# Patient Record
Sex: Male | Born: 1990 | Race: Black or African American | Hispanic: No | Marital: Single | Smoking: Current every day smoker
Health system: Southern US, Community
[De-identification: ages and names within clinical notes are randomized; demographics above are authoritative.]

## PROBLEM LIST (undated history)

## (undated) DIAGNOSIS — F23 Brief psychotic disorder: Secondary | ICD-10-CM

## (undated) DIAGNOSIS — F419 Anxiety disorder, unspecified: Secondary | ICD-10-CM

---

## 2018-03-01 ENCOUNTER — Emergency Department (HOSPITAL_COMMUNITY)
Admission: EM | Admit: 2018-03-01 | Discharge: 2018-03-01 | Disposition: A | Discharge status: Home / Self Care | Payer: Medicaid Other | Attending: Emergency Medicine | Admitting: Emergency Medicine

## 2018-03-01 ENCOUNTER — Other Ambulatory Visit: Payer: Self-pay

## 2018-03-01 ENCOUNTER — Encounter (HOSPITAL_COMMUNITY): Payer: Self-pay

## 2018-03-01 DIAGNOSIS — M25532 Pain in left wrist: Secondary | ICD-10-CM | POA: Diagnosis not present

## 2018-03-01 DIAGNOSIS — M79645 Pain in left finger(s): Secondary | ICD-10-CM | POA: Diagnosis present

## 2018-03-01 HISTORY — DX: Brief psychotic disorder: F23

## 2018-03-01 HISTORY — DX: Anxiety disorder, unspecified: F41.9

## 2018-03-01 MED ORDER — NAPROXEN 500 MG PO TABS: 500.0000 mg | ORAL_TABLET | Freq: Two times a day (BID) | ORAL | 0 refills | Status: AC

## 2018-03-01 NOTE — Discharge Instructions (Addendum)
Take naproxen 2 times a day with meals.  Do not take other anti-inflammatories at the same time open (Advil, Motrin, ibuprofen, Aleve). You may supplement with Tylenol if you need further pain control. Use the wrist splint as needed for comfort.  Wear it at night and during the day if able. Follow-up with [facility] for further evaluation of your wrist if pain is not improving in a week. Return to the emergency room if you develop inability to move your hand, persistent numbness of your hand, or any new or concerning symptoms.

## 2018-03-01 NOTE — ED Notes (Signed)
Pt states has left thumb pain. States it feels like it starts from under his part. Pt able to move left extremity well. Cap refill less than two seconds. States sleeps on left arm. Denies injury to left arm or thumb

## 2018-03-01 NOTE — ED Provider Notes (Signed)
[facility] EMERGENCY DEPARTMENT Provider Note   CSN: [id number] Arrival date & time: 03/01/18  1817     History   Chief Complaint Chief Complaint  Patient presents with  . thumb/forearm pain    HPI Brian Fernandez is a 27 y.o. male presenting for evaluation of left-sided thumb and forearm pain.  Patient states he has intermittent pain beginning in his thumb and radiating up his forearm.  This is been going on for the past 3 days.  He reports he has been playing more basketball recently, no other change in activity.  He is repetitive motion.  He denies fall, trauma, or injury.  This began when he pushed up from his chair with his hand.  He states he intermittently sleeps on his left hand.  Denies numbness or tingling.  Denies redness, warmth, or swelling.  No pain elsewhere.  He has not taken anything for his symptoms including Tylenol or ibuprofen.  HPI  Past Medical History:  Diagnosis Date  . Anxiety   . Schizophrenia, acute (HCC)     There are no active problems to display for this patient.   History reviewed. No pertinent surgical history.      Home Medications    Prior to Admission medications   Medication Sig Start Date End Date Taking? Authorizing Provider  naproxen (NAPROSYN) 500 MG tablet Take 1 tablet (500 mg total) by mouth 2 (two) times daily with a meal. 03/01/18   Kenyanna Grzesiak, PA-C    Family History No family history on file.  Social History Social History   Tobacco Use  . Smoking status: Not on file  Substance Use Topics  . Alcohol use: Not on file  . Drug use: Not on file     Allergies   Patient has no known allergies.   Review of Systems Review of Systems  Musculoskeletal: Positive for arthralgias and myalgias.  Neurological: Negative for numbness.     Physical Exam Updated Vital Signs BP 126/76 (BP Location: Right Arm)   Pulse 61   Temp 98.3 F (36.8 C) (Oral)   Resp 19   Ht 5\' 11"  (1.803 m)   Wt  106.1 kg (234 lb)   SpO2 98%   BMI 32.64 kg/m   Physical Exam  Constitutional: He is oriented to person, place, and time. He appears well-developed and well-nourished. No distress.  HENT:  Head: Normocephalic and atraumatic.  Eyes: EOM are normal.  Neck: Normal range of motion.  Pulmonary/Chest: Effort normal.  Abdominal: He exhibits no distension.  Musculoskeletal: Normal range of motion.  Full active range of motion of the wrist and thumb without pain.  No obvious swelling, warmth, erythema.  No deformity.  Strength of all fingers against resistance intact.  Radial pulses intact bilaterally.  Sensation intact bilaterally.  No pain with Phalen's or Tinel's.  Mild discomfort with Finkelstein's.  Good cap refill.  Neurological: He is alert and oriented to person, place, and time. No sensory deficit.  Skin: Skin is warm. Capillary refill takes less than 2 seconds. No rash noted.  Psychiatric: He has a normal mood and affect.  Nursing note and vitals reviewed.    ED Treatments / Results  Labs (all labs ordered are listed, but only abnormal results are displayed) Labs Reviewed - No data to display  EKG None  Radiology No results found.  Procedures Procedures (including critical care time)  Medications Ordered in ED Medications - No data to display   Initial Impression / Assessment  and Plan / ED Course  I have reviewed the triage vital signs and the nursing notes.  Pertinent labs & imaging results that were available during my care of the patient were reviewed by me and considered in my medical decision making (see chart for details).     Patient presenting for evaluation of intermittent left thumb and forearm pain.  Physical exam reassuring, he is neurovascularly intact.  Doubt septic joint.  Likely muscular, due to patient's intermittent behavior and beginning with pushing up off the chair.  Doubt bony injury, I do not believe x-rays would be beneficial at this time.   Discussed findings with patient.  Discussed treatment with NSAIDs and wrist splint.  Follow-up with primary care as needed.  At this time, patient appears safe for discharge.  Return precautions given.  Patient states he understands and agrees to plan.   Final Clinical Impressions(s) / ED Diagnoses   Final diagnoses:  Left wrist pain    ED Discharge Orders        Ordered    naproxen (NAPROSYN) 500 MG tablet  2 times daily with meals     03/01/18 2022       Alveria ApleyCaccavale, Keagon Glascoe, PA-C 03/01/18 2245    Nira Connardama, Pedro Eduardo, MD 03/02/18 1309

## 2018-03-01 NOTE — ED Triage Notes (Signed)
Patient reports intermittent left hand pain running up forearm, describes as sharp/tingling sensation along radial nerve, denies trauma. Good equal grips, no trauma

## 2018-03-01 NOTE — Progress Notes (Signed)
Orthopedic Tech Progress Note Patient Details:  Brian SalinaDeshawn Tinkey 1991/09/21 [id number]  Ortho Devices Type of Ortho Device: Velcro wrist splint Ortho Device/Splint Interventions: Application   Post Interventions Patient Tolerated: Well Instructions Provided: Care of device   Saul FordyceJennifer C Carlus Stay 03/01/2018, 8:30 PM

## 2018-07-17 ENCOUNTER — Encounter (HOSPITAL_COMMUNITY): Payer: Self-pay

## 2018-07-17 ENCOUNTER — Emergency Department (HOSPITAL_COMMUNITY)
Admission: EM | Admit: 2018-07-17 | Discharge: 2018-07-17 | Disposition: A | Discharge status: Home / Self Care | Payer: Medicaid Other | Attending: Emergency Medicine | Admitting: Emergency Medicine

## 2018-07-17 ENCOUNTER — Other Ambulatory Visit: Payer: Self-pay

## 2018-07-17 DIAGNOSIS — Z79899 Other long term (current) drug therapy: Secondary | ICD-10-CM | POA: Diagnosis not present

## 2018-07-17 DIAGNOSIS — F17210 Nicotine dependence, cigarettes, uncomplicated: Secondary | ICD-10-CM | POA: Diagnosis not present

## 2018-07-17 DIAGNOSIS — J020 Streptococcal pharyngitis: Secondary | ICD-10-CM | POA: Diagnosis not present

## 2018-07-17 DIAGNOSIS — J029 Acute pharyngitis, unspecified: Secondary | ICD-10-CM | POA: Diagnosis present

## 2018-07-17 LAB — GROUP A STREP BY PCR: Group A Strep by PCR: DETECTED — AB

## 2018-07-17 MED ORDER — CLINDAMYCIN HCL 300 MG PO CAPS: 300.0000 mg | ORAL_CAPSULE | Freq: Three times a day (TID) | ORAL | 0 refills | Status: AC

## 2018-07-17 NOTE — ED Provider Notes (Signed)
[facility] EMERGENCY DEPARTMENT Provider Note   CSN: [id number] Arrival date & time: 07/17/18  1021     History   Chief Complaint Chief Complaint  Patient presents with  . Sore Throat    HPI Brian Fernandez is a 27 y.o. male presenting for 5 days of sore throat.  Patient states that pain developed gradually over the last 5 days and has worsened, now describes his pain as a sharp 8/10 in severity that is worse with swallowing.  Patient states that he has not taken any medication for his pain.  Patient states that he has felt warm but has not measured a fever.  Patient denies change in appetite, drooling, voice change, inability to swallow solids or liquids, facial swelling, shortness of breath.  HPI  Past Medical History:  Diagnosis Date  . Anxiety   . Schizophrenia, acute (HCC)     There are no active problems to display for this patient.   History reviewed. No pertinent surgical history.      Home Medications    Prior to Admission medications   Medication Sig Start Date End Date Taking? Authorizing Provider  LATUDA 80 MG TABS tablet Take 80 mg by mouth daily. 06/13/18  Yes [provider]  traZODone (DESYREL) 100 MG tablet Take 100 mg by mouth at bedtime. 06/13/18  Yes [provider]  clindamycin (CLEOCIN) 300 MG capsule Take 1 capsule (300 mg total) by mouth 3 (three) times daily for 10 days. 07/17/18 07/27/18  Harlene Salts A, PA-C  naproxen (NAPROSYN) 500 MG tablet Take 1 tablet (500 mg total) by mouth 2 (two) times daily with a meal. Patient not taking: Reported on 07/17/2018 03/01/18   Alveria Apley, PA-C    Family History History reviewed. No pertinent family history.  Social History Social History   Tobacco Use  . Smoking status: Current Every Day Smoker    Packs/day: 0.50    Types: Cigarettes  Substance Use Topics  . Alcohol use: Yes    Comment: occ  . Drug use: Never     Allergies    Penicillins   Review of Systems Review of Systems  Constitutional: Positive for fever. Negative for chills and fatigue.  HENT: Positive for sore throat. Negative for congestion, rhinorrhea, trouble swallowing and voice change.   Respiratory: Negative.  Negative for cough, shortness of breath and stridor.   Gastrointestinal: Negative.  Negative for abdominal pain, diarrhea, nausea and vomiting.  Musculoskeletal: Negative.  Negative for arthralgias and myalgias.  Skin: Negative.  Negative for rash.     Physical Exam Updated Vital Signs BP 129/68 (BP Location: Left Arm)   Pulse 60   Temp 98.4 F (36.9 C) (Oral)   Resp 16   Ht 5\' 10"  (1.778 m)   Wt 106.1 kg   SpO2 100%   BMI 33.58 kg/m   Physical Exam  Constitutional: He is oriented to person, place, and time. He appears well-developed and well-nourished. He does not appear ill. No distress.  HENT:  Head: Normocephalic and atraumatic.  Right Ear: Hearing and external ear normal.  Left Ear: Hearing and external ear normal.  Nose: Nose normal.  Mouth/Throat: Uvula is midline. No trismus in the jaw. No uvula swelling. No posterior oropharyngeal edema, posterior oropharyngeal erythema or tonsillar abscesses. Tonsils are 2+ on the right. Tonsils are 2+ on the left. Tonsillar exudate.  Moderate bilateral tonsillar swelling with erythema scant exudate present. Airway intact, tonsils are not touching midline, there is no uvula  deviation, patient can swallow without difficulty.  No signs suggestive of peritonsillar abscess. No signs suggestive of Ludwig's angina. No signs suggestive of retropharyngeal abscess.  Eyes: Pupils are equal, round, and reactive to light. EOM are normal.  Neck: Trachea normal, normal range of motion, full passive range of motion without pain and phonation normal. Neck supple. No spinous process tenderness and no muscular tenderness present. No neck rigidity. No tracheal deviation and normal range of motion  present.    Right sided cervical anterior lymphadenopathy.  Pulmonary/Chest: Effort normal. No stridor. No respiratory distress.  Abdominal: Soft. There is no tenderness. There is no rebound and no guarding.  Musculoskeletal: Normal range of motion.  Lymphadenopathy:    He has cervical adenopathy.  Neurological: He is alert and oriented to person, place, and time.  Skin: Skin is warm and dry.  Psychiatric: He has a normal mood and affect. His behavior is normal.     ED Treatments / Results  Labs (all labs ordered are listed, but only abnormal results are displayed) Labs Reviewed  GROUP A STREP BY PCR - Abnormal; Notable for the following components:      Result Value   Group A Strep by PCR DETECTED (*)    All other components within normal limits    EKG None  Radiology No results found.  Procedures Procedures (including critical care time)  Medications Ordered in ED Medications - No data to display   Initial Impression / Assessment and Plan / ED Course  I have reviewed the triage vital signs and the nursing notes.  Pertinent labs & imaging results that were available during my care of the patient were reviewed by me and considered in my medical decision making (see chart for details).    Patient afebrile with tonsillar exudate, right cervical lymphadenopathy, & dysphagia; diagnosis of strep.  Presentation non concerning for PTA or infxn spread to soft tissue.  No sign of retropharyngeal abscess or Ludwig's angina.  No trismus or uvula deviation. Specific return precautions discussed. Pt able to drink water in ED without difficulty with intact air way.  Patient with penicillin allergy.  This was discussed with patient, clindamycin 300 p.o. 3 times daily prescribed for the next 10 days.  At this time there does not appear to be any evidence of an acute emergency medical condition and the patient appears stable for discharge with appropriate outpatient follow up. Diagnosis  was discussed with patient who verbalizes understanding of care plan and is agreeable to discharge. I have discussed return precautions with patient who verbalizes understanding of return precautions. Patient strongly encouraged to follow-up with their PCP. All questions answered.   Note: Portions of this report may have been transcribed using voice recognition software. Every effort was made to ensure accuracy; however, inadvertent computerized transcription errors may still be present.    Final Clinical Impressions(s) / ED Diagnoses   Final diagnoses:  Strep pharyngitis    ED Discharge Orders         Ordered    clindamycin (CLEOCIN) 300 MG capsule  3 times daily     07/17/18 1227           Elizabeth PalauMorelli, Brandon A, PA-C 07/17/18 1436    Tegeler, Canary Brimhristopher J, MD 07/17/18 1525

## 2018-07-17 NOTE — ED Triage Notes (Signed)
Pt endorses sore throat x 1 week. Redness and mild swelling noted to tonsils. VSS, Afebrile.

## 2018-07-17 NOTE — Discharge Instructions (Addendum)
Please return to the Emergency Department for any new or worsening symptoms or if your symptoms do not improve. Please be sure to follow up with your Primary Care Physician as soon as possible regarding your visit today. If you do not have a Primary Doctor please use the resources below to establish one. Please take all of your antibiotic medication, clindamycin as prescribed.  Please take an over-the-counter probiotic medication to try and avoid diarrhea while using this medication. You may also use over-the-counter anti-inflammatory medications such as Tylenol as directed on the package for pain. Please be sure to drink plenty of fluids to avoid dehydration.  Contact a health care provider if: The glands in your neck continue to get bigger. You develop a rash, cough, or earache. You cough up a thick liquid that is green, yellow-brown, or bloody. You have pain or discomfort that does not get better with medicine. Your problems seem to be getting worse rather than better. You have a fever. Get help right away if: You have new symptoms, such as vomiting, severe headache, stiff or painful neck, chest pain, or shortness of breath. You have severe throat pain, drooling, or changes in your voice. You have swelling of the neck, or the skin on the neck becomes red and tender. You have signs of dehydration, such as fatigue, dry mouth, and decreased urination. You become increasingly sleepy, or you cannot wake up completely. Your joints become red or painful. You have trouble swallowing.  RESOURCE GUIDE  Chronic Pain Problems: Contact [facility]  [phone] Patients need to be referred by their primary care doctor.  Insufficient Money for Medicine: Contact United Way:  call "211" or [facility] [phone].  No Primary Care Doctor: Call [facility]  [phone] - can help you locate a primary care doctor that  accepts your insurance, provides certain services,  etc. Physician Referral Service- [phone]  Agencies that provide inexpensive medical care: [facility]  [phone] [facility]  [phone] [facility]  [phone] [facility]  [phone] Planned Parenthood  [phone] [facility]  [phone]  Medicaid-accepting [facility] Providers: [facility]- [address], [address]  [phone], Mon-Fri 9am-7pm, Sat 9am-1pm [facility]- [address], [address] [place]  [phone] [facility]- [address], [address] [place]  [phone] [facility]- [address]  [phone] Renaye RakersVeita Bland- [address], [address], [phone]  Only accepts [place] patients after they have their name  applied to their card  Self Pay (no insurance) in [place] [facility]: Sickle Cell Patients: Dr Willey BladeEric Dean, [facility]  [address], [phone] [facility]- [address]  [phone]       Redge Gainer-     [facility]- [address], [address]       -     [facility]- see information above (Speak to CitigroupPam H if you do not have insurance)       -  [facility]- [address], [phone]       -  [facility]- [address],  [phone]       -  [facility]- [address] [place] [address], [phone]       -  Dr Julio Sickssei-Bonsu-  [address], [address], [place], [phone]       -  [facility]- [address] [phone]       -  [facility]- [address] [phone], also [address] [phone]       -    [facility]- [address] [place], [phone], 1st & 3rd Saturday   every month, 10am-1pm  1) Find a Doctor and Pay Out of Pocket Although you won't have to find out who is covered by your insurance plan, it is a good idea to ask around and get recommendations. You will then need to call the  office and see if the doctor you have chosen will accept you as a new patient and what types of options they offer for patients who are self-pay. Some doctors offer discounts or will set up payment plans for their patients who do not have insurance, but you will need to ask so you aren't surprised when you get to your appointment.  2) Contact Your Local Health Department Not all health departments have doctors that can see patients for sick visits, but many do, so it is worth a call to see if yours does. If you don't know where your local health department is, you can check in your phone book. The CDC also has a tool to help you locate your state's health department, and many state websites also have listings of all of their local health departments.  3) Find a Walk-in Clinic If your illness is not likely to be very severe or complicated, you may want to try a walk in clinic. These are popping up all over the country in pharmacies, drugstores, and shopping centers. They're usually staffed by nurse practitioners or physician assistants that have been trained to treat common illnesses and complaints. They're usually fairly quick and inexpensive. However, if you have serious medical issues or chronic medical problems, these are probably not your best option  STD Testing [facility] of [facility] [place], STD Clinic, [address] [place], phone [phone] or [phone].  Monday - Friday, call for an appointment. [facility] of [employer], STD Clinic, [address], [place], phone [phone] or [phone].  Monday - Friday, call for an appointment.  Abuse/Neglect: [facility] Child Abuse Hotline 437 608 5087 [facility] Child Abuse Hotline 858-155-8576 (After Hours)  Emergency Shelter:  [facility] 231-424-8366  Maternity Homes: [facility] 517-834-8293 [facility] [phone]  MRSA Hotline #:   (775) 870-5311  [employer] Resources  [facility] [place]  United Way [facility] Dept. [address]                 [address]         [address]  [place]                                               Cristobal Goldmann Phone:  [phone]  Phone:  [phone]                   Phone:  [place], [place]- [phone]       -     [facility] in [place], [address],                                  [place] [phone] or [phone] (After Hours)   [place]  Substance Abuse Resources: Alcohol and Drug Services  430-781-4845 [place] 979-062-5835 [facility] Chinita Pester 7184048608 Residential & Outpatient Substance Abuse Program  4634431883  Psychological Services: [place]  [phone] [place], [place]. [address] [place], [place]: [phone] or [phone], PicCapture.uy  Dental Assistance  If unable to pay or uninsured, contact:  [facility] or [facility]. to become qualified for the adult dental clinic.  Patients with Medicaid: [facility] [phone], [place] [address], [phone]  If unable to pay, or uninsured, contact [facility] [phone]) or [place] [phone] in [place], [place] in [facility]) to become qualified for the adult dental clinic   Other [place], [place], [place], [address], [place], [place], 2nd and 4th Thursday of the month  at 6:30am.  10 clients each day by appointment, can sometimes see walk-in patients if someone does not show for an appointment. [facility]- [address] Hillard Danker [place], [place], [address], [place], [place], [place], [address], [place] Department- [phone] [place] [facility] [phone]

## 2019-09-01 ENCOUNTER — Emergency Department (HOSPITAL_COMMUNITY): Payer: Medicare Other

## 2019-09-01 ENCOUNTER — Other Ambulatory Visit: Payer: Self-pay

## 2019-09-01 ENCOUNTER — Emergency Department (HOSPITAL_COMMUNITY)
Admission: EM | Admit: 2019-09-01 | Discharge: 2019-09-01 | Disposition: A | Discharge status: Home / Self Care | Payer: Medicare Other | Attending: Emergency Medicine | Admitting: Emergency Medicine

## 2019-09-01 ENCOUNTER — Encounter (HOSPITAL_COMMUNITY): Payer: Self-pay | Admitting: Emergency Medicine

## 2019-09-01 DIAGNOSIS — Z79899 Other long term (current) drug therapy: Secondary | ICD-10-CM | POA: Diagnosis not present

## 2019-09-01 DIAGNOSIS — R079 Chest pain, unspecified: Secondary | ICD-10-CM | POA: Diagnosis not present

## 2019-09-01 DIAGNOSIS — F17210 Nicotine dependence, cigarettes, uncomplicated: Secondary | ICD-10-CM | POA: Diagnosis not present

## 2019-09-01 MED ORDER — FAMOTIDINE 20 MG PO TABS: 20.0000 mg | ORAL_TABLET | Freq: Two times a day (BID) | ORAL | 0 refills | Status: AC

## 2019-09-01 NOTE — Discharge Instructions (Signed)
Please read attached information. If you experience any new or worsening signs or symptoms please return to the emergency room for evaluation. Please follow-up with your primary care provider or specialist as discussed. Please use medication prescribed only as directed and discontinue taking if you have any concerning signs or symptoms.   °

## 2019-09-01 NOTE — ED Provider Notes (Signed)
[facility] EMERGENCY DEPARTMENT Provider Note   CSN: [id number] Arrival date & time: 09/01/19  1549     History   Chief Complaint Chief Complaint  Patient presents with  . Chest Pain    HPI Brian Fernandez is a 28 y.o. male.     HPI   28 year old male presents today with complaints of chest pain.  Patient notes over the last month he has had intermittent sharp chest pain.  He notes this is completely random other than when he is getting ready to fall asleep.  He notes that sharp in nature coming and going.  He notes every day he has the symptoms they have been more frequent recently.  He denies any radiation of symptoms.  Denies any indigestion or burning up into his throat.  Denies any associated shortness of breath cough, or fever.  He denies any history DVT or PE or any significant cardiac history.  He notes he does smoke.  Denies any other chronic health conditions.  No medications prior to arrival.  Patient is not having symptoms at the time of my evaluation but notes he had a 1 episode prior to my arrival.   Past Medical History:  Diagnosis Date  . Anxiety   . Schizophrenia, acute ([place])     There are no active problems to display for this patient.   History reviewed. No pertinent surgical history.     Home Medications    Prior to Admission medications   Medication Sig Start Date End Date Taking? Authorizing Provider  famotidine (PEPCID) 20 MG tablet Take 1 tablet (20 mg total) by mouth 2 (two) times daily. 09/01/19   Eural Holzschuh, Dellis Filbert, PA-C  LATUDA 80 MG TABS tablet Take 80 mg by mouth daily. 06/13/18   [provider]  naproxen (NAPROSYN) 500 MG tablet Take 1 tablet (500 mg total) by mouth 2 (two) times daily with a meal. Patient not taking: Reported on 07/17/2018 03/01/18   Caccavale, Sophia, PA-C  traZODone (DESYREL) 100 MG tablet Take 100 mg by mouth at bedtime. 06/13/18   [provider]    Family History No family history on  file.  Social History Social History   Tobacco Use  . Smoking status: Current Every Day Smoker    Packs/day: 0.50    Types: Cigarettes  Substance Use Topics  . Alcohol use: Yes    Comment: occ  . Drug use: Never     Allergies   Penicillins   Review of Systems Review of Systems  All other systems reviewed and are negative.  Physical Exam Updated Vital Signs BP 127/79 (BP Location: Left Arm)   Pulse 65   Temp 98.1 F (36.7 C) (Oral)   Resp 18   SpO2 100%   Physical Exam Vitals signs and nursing note reviewed.  Constitutional:      Appearance: He is well-developed.  HENT:     Head: Normocephalic and atraumatic.  Eyes:     General: No scleral icterus.       Right eye: No discharge.        Left eye: No discharge.     Conjunctiva/sclera: Conjunctivae normal.     Pupils: Pupils are equal, round, and reactive to light.  Neck:     Musculoskeletal: Normal range of motion.     Vascular: No JVD.     Trachea: No tracheal deviation.  Cardiovascular:     Rate and Rhythm: Normal rate and regular rhythm.  Pulmonary:  Effort: Pulmonary effort is normal. No respiratory distress.     Breath sounds: Normal breath sounds. No stridor. No wheezing, rhonchi or rales.  Chest:     Chest wall: No tenderness.  Musculoskeletal:        General: No swelling.     Comments: No LEE  Neurological:     Mental Status: He is alert and oriented to person, place, and time.     Coordination: Coordination normal.  Psychiatric:        Behavior: Behavior normal.        Thought Content: Thought content normal.        Judgment: Judgment normal.      ED Treatments / Results  Labs (all labs ordered are listed, but only abnormal results are displayed) Labs Reviewed - No data to display  EKG EKG Interpretation  Date/Time:  Tuesday September 01 2019 16:19:49 EDT Ventricular Rate:  68 PR Interval:  156 QRS Duration: 82 QT Interval:  360 QTC Calculation: 382 R Axis:   93 Text  Interpretation:  Normal sinus rhythm Rightward axis Cannot rule out Anterior infarct , age undetermined Abnormal ECG Confirmed by Zadie Rhine ([id number]) on 09/02/2019 9:32:08 AM   Radiology Dg Chest 2 View  Result Date: 09/01/2019 CLINICAL DATA:  Chest pain EXAM: CHEST - 2 VIEW COMPARISON:  None. FINDINGS: The heart size and mediastinal contours are within normal limits. Both lungs are clear. The visualized skeletal structures are unremarkable. IMPRESSION: No active cardiopulmonary disease. Electronically Signed   By: Jasmine Pang M.D.   On: 09/01/2019 19:05    Procedures Procedures (including critical care time)  Medications Ordered in ED Medications - No data to display   Initial Impression / Assessment and Plan / ED Course  I have reviewed the triage vital signs and the nursing notes.  Pertinent labs & imaging results that were available during my care of the patient were reviewed by me and considered in my medical decision making (see chart for details).         Assessment/Plan: 28 year old male presents today with chest discomfort.  This is sharp in nature coming and going nonsevere.  He has no signs or symptoms presently.  EKG reassuring very low suspicion for any ACS dissection or pulmonary embolism.  He has no signs of infectious etiology.  Will obtain chest x-ray.  Anticipate discharge with outpatient primary care follow-up.  Lower suspicion for GI source but will attempt Pepcid therapy.    Final Clinical Impressions(s) / ED Diagnoses   Final diagnoses:  Chest pain, unspecified type    ED Discharge Orders         Ordered    famotidine (PEPCID) 20 MG tablet  2 times daily     09/01/19 1815           Eyvonne Mechanic, PA-C 09/02/19 1439    Lorre Nick, MD 09/02/19 1651

## 2019-09-01 NOTE — ED Triage Notes (Signed)
Pt endorses CP last month under left breast when sneezing. Endorses back pain

## 2019-09-03 ENCOUNTER — Encounter (HOSPITAL_COMMUNITY): Payer: Self-pay | Admitting: Emergency Medicine

## 2019-09-03 ENCOUNTER — Other Ambulatory Visit: Payer: Self-pay

## 2019-09-03 ENCOUNTER — Emergency Department (HOSPITAL_COMMUNITY)
Admission: EM | Admit: 2019-09-03 | Discharge: 2019-09-03 | Disposition: A | Discharge status: Home / Self Care | Payer: Medicare Other | Attending: Emergency Medicine | Admitting: Emergency Medicine

## 2019-09-03 DIAGNOSIS — F419 Anxiety disorder, unspecified: Secondary | ICD-10-CM | POA: Insufficient documentation

## 2019-09-03 DIAGNOSIS — Z79899 Other long term (current) drug therapy: Secondary | ICD-10-CM | POA: Insufficient documentation

## 2019-09-03 DIAGNOSIS — F17210 Nicotine dependence, cigarettes, uncomplicated: Secondary | ICD-10-CM | POA: Insufficient documentation

## 2019-09-03 DIAGNOSIS — F209 Schizophrenia, unspecified: Secondary | ICD-10-CM | POA: Insufficient documentation

## 2019-09-03 MED ORDER — LURASIDONE HCL 40 MG PO TABS
80.0000 mg | ORAL_TABLET | Freq: Once | ORAL | Status: AC
  Administered 2019-09-03: 80 mg via ORAL
  Filled 2019-09-03: qty 2

## 2019-09-03 NOTE — Discharge Instructions (Signed)
Please follow up with your doctor tomorrow as previously scheduled to have your medication refill and to discuss better management of your anxiety.

## 2019-09-03 NOTE — ED Triage Notes (Signed)
Pt reports an increase in his anxiety. Pt reports he is supposed to be on Latuda but has not had it in a month. Pt reports he needs to get his prescription refilled and has a doctor's appt on 10/9.

## 2019-09-03 NOTE — ED Provider Notes (Signed)
[facility] EMERGENCY DEPARTMENT Provider Note   CSN: [id number] Arrival date & time: 09/03/19  1142     History   Chief Complaint Chief Complaint  Patient presents with  . Anxiety    HPI Brian Fernandez is a 28 y.o. male.     The history is provided by the patient and medical records. No language interpreter was used.  Anxiety     28 year old male with known history of schizophrenia, anxiety, presenting with complaints of feeling anxious.  Patient report he ran out of his Latuda medication for approximately a week.  He believes his doctor did not prescribe enough.  He does have an appointment with his doctor tomorrow for medication refill.  He feeling increasingly anxious and felt that is related to not being on his medication.  He does not complain of any homicidal or suicidal ideation.  He does not have any other specific complaint.  Patient is here requesting for medication refilled.  Denies any increase in stress.  Past Medical History:  Diagnosis Date  . Anxiety   . Schizophrenia, acute ([place])     There are no active problems to display for this patient.   History reviewed. No pertinent surgical history.      Home Medications    Prior to Admission medications   Medication Sig Start Date End Date Taking? Authorizing Provider  famotidine (PEPCID) 20 MG tablet Take 1 tablet (20 mg total) by mouth 2 (two) times daily. 09/01/19   Hedges, Dellis Filbert, PA-C  LATUDA 80 MG TABS tablet Take 80 mg by mouth daily. 06/13/18   [provider]  naproxen (NAPROSYN) 500 MG tablet Take 1 tablet (500 mg total) by mouth 2 (two) times daily with a meal. Patient not taking: Reported on 07/17/2018 03/01/18   Caccavale, Sophia, PA-C  traZODone (DESYREL) 100 MG tablet Take 100 mg by mouth at bedtime. 06/13/18   [provider]    Family History No family history on file.  Social History Social History   Tobacco Use  . Smoking status: Current Every Day  Smoker    Packs/day: 0.50    Types: Cigarettes  . Smokeless tobacco: Never Used  Substance Use Topics  . Alcohol use: Yes    Comment: occ  . Drug use: Never     Allergies   Penicillins   Review of Systems Review of Systems  All other systems reviewed and are negative.    Physical Exam Updated Vital Signs BP 114/72 (BP Location: Left Arm)   Pulse 74   Temp 97.9 F (36.6 C) (Oral)   Resp 20   Ht 5\' 11"  (1.803 m)   Wt 122.5 kg   SpO2 96%   BMI 37.66 kg/m   Physical Exam Vitals signs and nursing note reviewed.  Constitutional:      General: He is not in acute distress.    Appearance: He is well-developed.  HENT:     Head: Atraumatic.  Eyes:     Conjunctiva/sclera: Conjunctivae normal.  Neck:     Musculoskeletal: Neck supple.  Cardiovascular:     Rate and Rhythm: Normal rate and regular rhythm.     Pulses: Normal pulses.     Heart sounds: Normal heart sounds.  Pulmonary:     Breath sounds: Normal breath sounds.  Abdominal:     Palpations: Abdomen is soft.  Skin:    Findings: No rash.  Neurological:     Mental Status: He is alert and oriented to person, place,  and time.  Psychiatric:        Mood and Affect: Mood normal.        Behavior: Behavior is cooperative.        Thought Content: Thought content does not include homicidal or suicidal ideation.      ED Treatments / Results  Labs (all labs ordered are listed, but only abnormal results are displayed) Labs Reviewed - No data to display  EKG None  Radiology Dg Chest 2 View  Result Date: 09/01/2019 CLINICAL DATA:  Chest pain EXAM: CHEST - 2 VIEW COMPARISON:  None. FINDINGS: The heart size and mediastinal contours are within normal limits. Both lungs are clear. The visualized skeletal structures are unremarkable. IMPRESSION: No active cardiopulmonary disease. Electronically Signed   By: Jasmine Pang M.D.   On: 09/01/2019 19:05    Procedures Procedures (including critical care time)   Medications Ordered in ED Medications - No data to display   Initial Impression / Assessment and Plan / ED Course  I have reviewed the triage vital signs and the nursing notes.  Pertinent labs & imaging results that were available during my care of the patient were reviewed by me and considered in my medical decision making (see chart for details).        BP 114/72 (BP Location: Left Arm)   Pulse 74   Temp 97.9 F (36.6 C) (Oral)   Resp 20   Ht 5\' 11"  (1.803 m)   Wt 122.5 kg   SpO2 96%   BMI 37.66 kg/m    Final Clinical Impressions(s) / ED Diagnoses   Final diagnoses:  Anxiety    ED Discharge Orders    None     1:50 PM Patient with history of schizophrenia currently on Latuda however he ran out of medication for over a week.  He is here with increased anxiety and request for a refill of his Latuda.  Fortunately he will be able to follow-up with his PCP tomorrow for medication refill.  He is denying SI or HI.  He is well-appearing, calm and cooperative, and in no acute discomfort.  We will give his home dose of Latuda here and encourage patient to follow-up with PCP tomorrow as previously scheduled.  Return precaution discussed.   , PA-C 09/03/19 1541    11/03/19, MD 09/04/19 [phone]

## 2019-09-03 NOTE — ED Notes (Signed)
Patient verbalizes understanding of discharge instructions. Opportunity for questioning and answers were provided. Armband removed by staff, pt discharged from ED ambulatory to home.  

## 2020-09-14 IMAGING — DX DG CHEST 2V
2 series · 2 of 2 positions shown · non-contrast
Comparison: None.

CLINICAL DATA: Chest pain

EXAM:
CHEST - 2 VIEW

[chest pa]
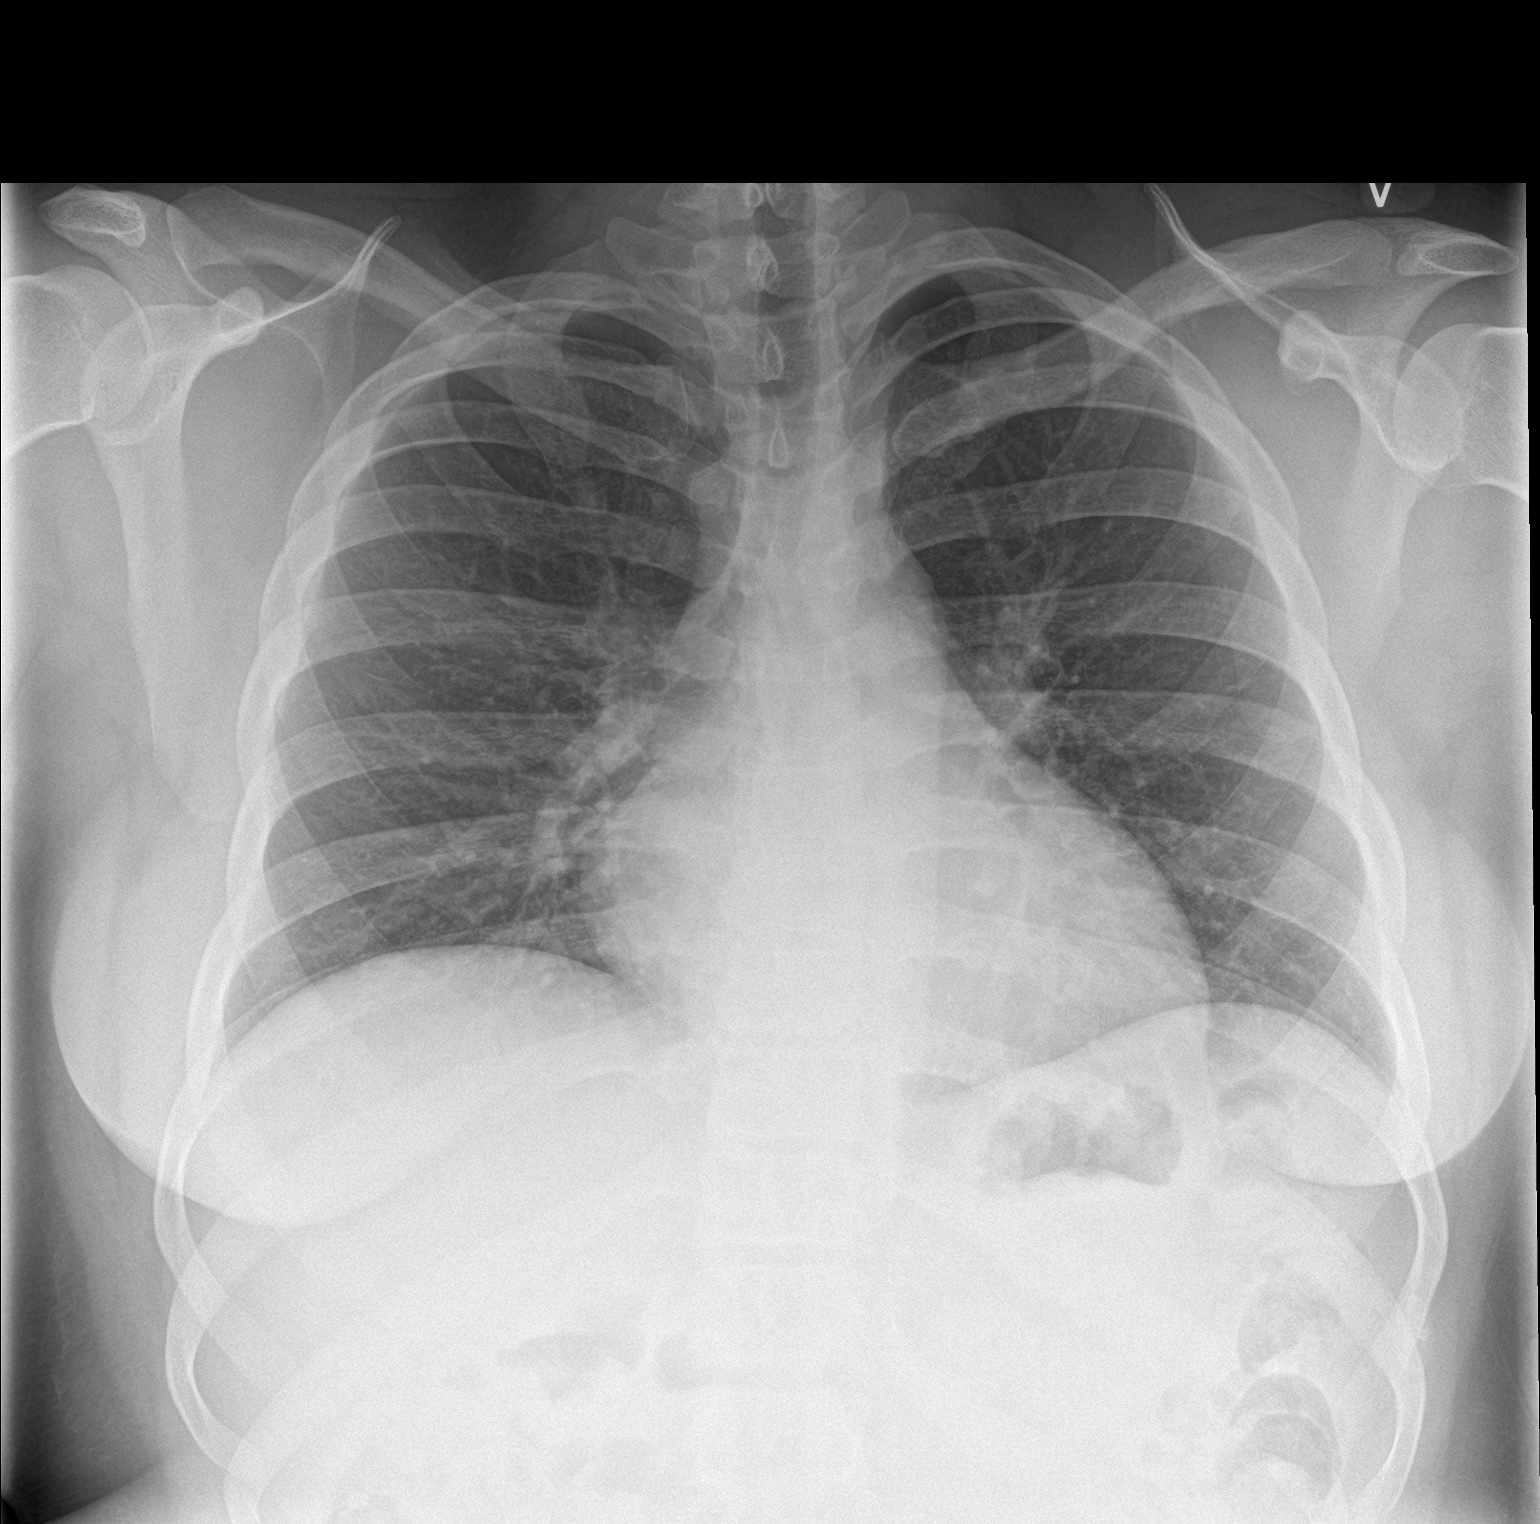

[chest lat]
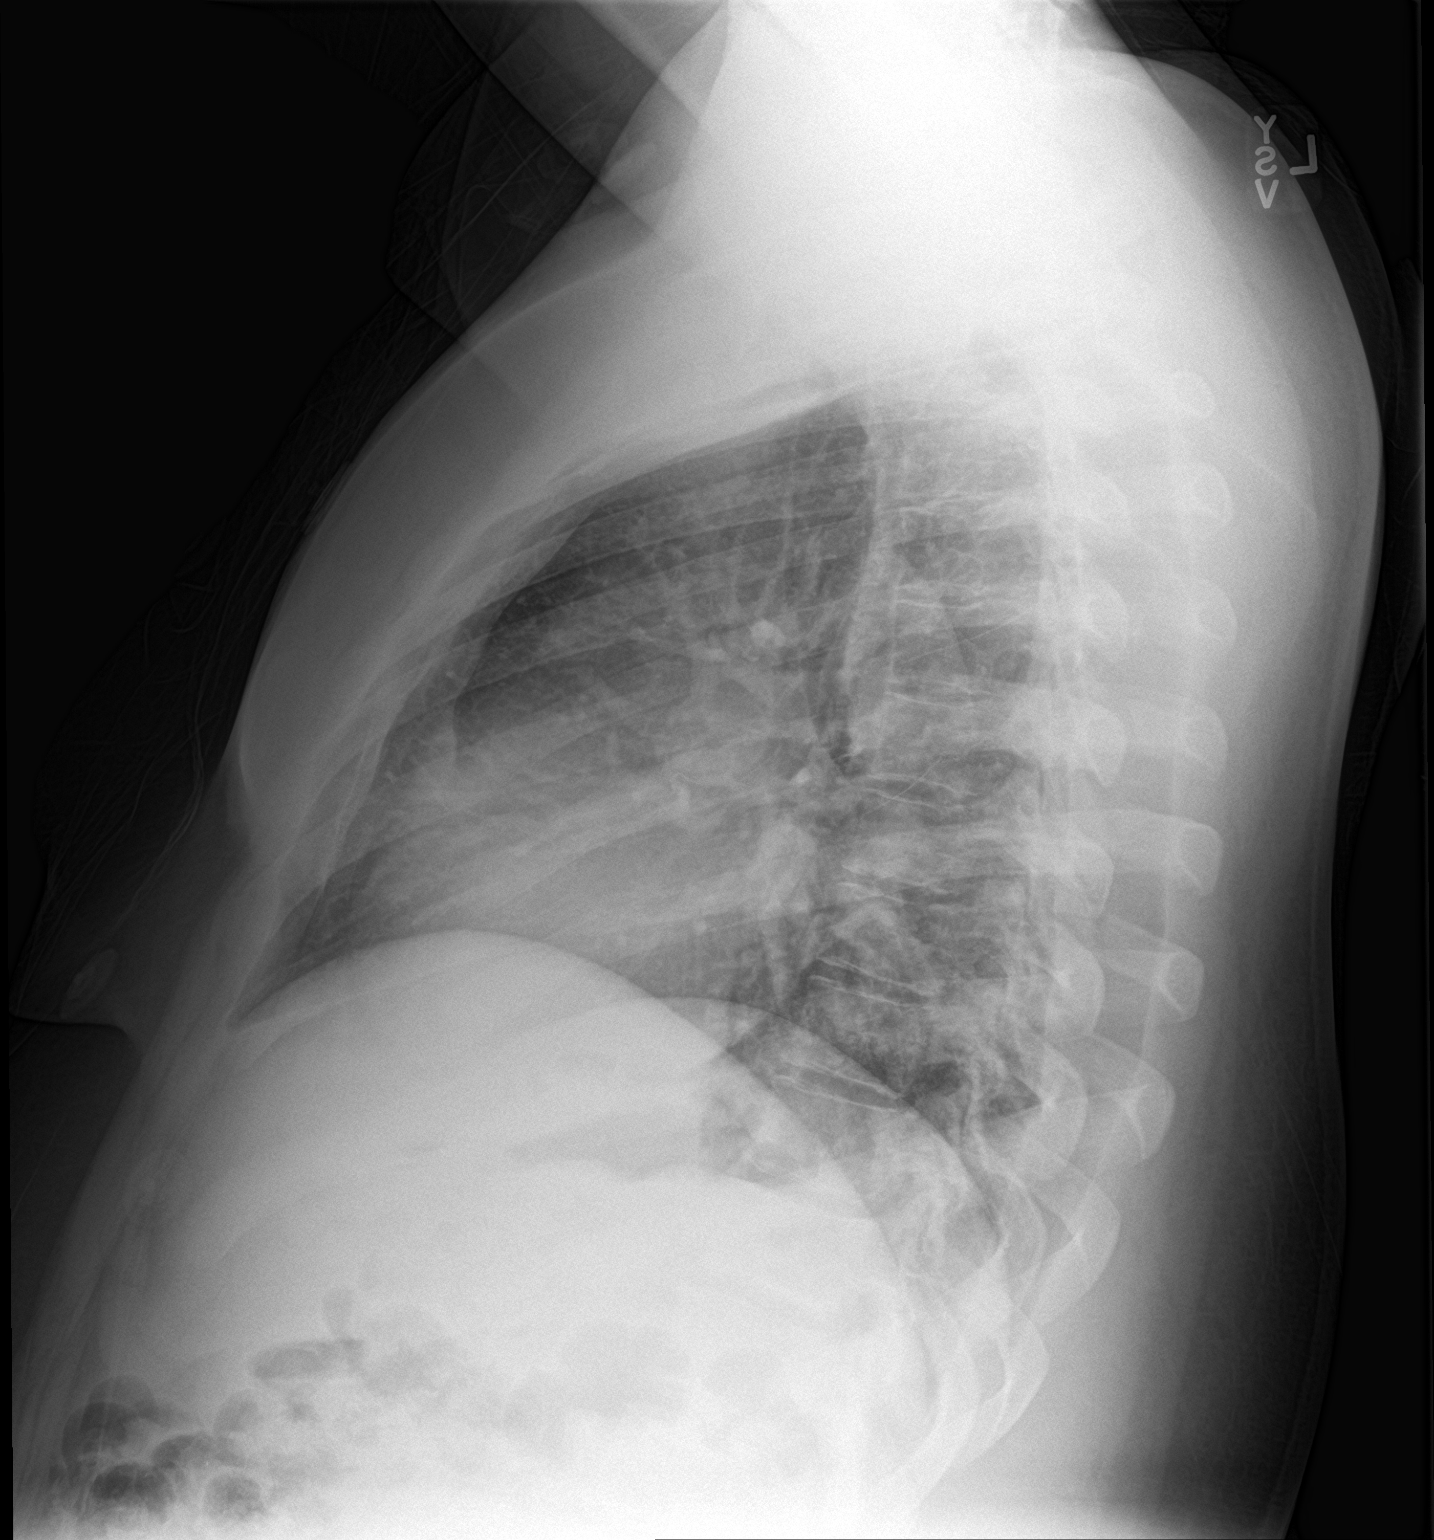

[2 of 2 positions shown; findings below may reference images not displayed]

FINDINGS: The heart size and mediastinal contours are within normal limits.
Both lungs are clear. The visualized skeletal structures are
unremarkable.
IMPRESSION: No active cardiopulmonary disease.
